# Patient Record
Sex: Male | Born: 1945 | Hispanic: No | Marital: Married | Smoking: Current every day smoker
Health system: Southern US, Community
[De-identification: ages and names within clinical notes are randomized; demographics above are authoritative.]

## PROBLEM LIST (undated history)

## (undated) DIAGNOSIS — I10 Essential (primary) hypertension: Secondary | ICD-10-CM

## (undated) HISTORY — PX: HERNIA REPAIR: SHX51

---

## 2016-02-08 ENCOUNTER — Emergency Department: Payer: Medicare Other

## 2016-02-08 ENCOUNTER — Emergency Department
Admission: EM | Admit: 2016-02-08 | Discharge: 2016-02-08 | Disposition: A | Discharge status: Home / Self Care | Payer: Medicare Other | Attending: Emergency Medicine | Admitting: Emergency Medicine

## 2016-02-08 ENCOUNTER — Encounter: Payer: Self-pay | Admitting: Emergency Medicine

## 2016-02-08 DIAGNOSIS — F17210 Nicotine dependence, cigarettes, uncomplicated: Secondary | ICD-10-CM | POA: Diagnosis not present

## 2016-02-08 DIAGNOSIS — R079 Chest pain, unspecified: Secondary | ICD-10-CM | POA: Diagnosis not present

## 2016-02-08 DIAGNOSIS — R1010 Upper abdominal pain, unspecified: Secondary | ICD-10-CM | POA: Diagnosis not present

## 2016-02-08 DIAGNOSIS — I10 Essential (primary) hypertension: Secondary | ICD-10-CM | POA: Diagnosis not present

## 2016-02-08 HISTORY — DX: Essential (primary) hypertension: I10

## 2016-02-08 LAB — URINALYSIS COMPLETE WITH MICROSCOPIC (ARMC ONLY)
BACTERIA UA: NONE SEEN
Bilirubin Urine: NEGATIVE
GLUCOSE, UA: NEGATIVE mg/dL
Ketones, ur: NEGATIVE mg/dL
Leukocytes, UA: NEGATIVE
Nitrite: NEGATIVE
Protein, ur: NEGATIVE mg/dL
SQUAMOUS EPITHELIAL / LPF: NONE SEEN
Specific Gravity, Urine: 1.006 (ref 1.005–1.030)
WBC UA: NONE SEEN WBC/hpf (ref 0–5)
pH: 5 (ref 5.0–8.0)

## 2016-02-08 LAB — LIPASE, BLOOD: Lipase: 21 U/L (ref 11–51)

## 2016-02-08 LAB — HEPATIC FUNCTION PANEL
ALT: 14 U/L — AB (ref 17–63)
AST: 21 U/L (ref 15–41)
Albumin: 3.8 g/dL (ref 3.5–5.0)
Alkaline Phosphatase: 60 U/L (ref 38–126)
BILIRUBIN DIRECT: 0.1 mg/dL (ref 0.1–0.5)
BILIRUBIN TOTAL: 0.8 mg/dL (ref 0.3–1.2)
Indirect Bilirubin: 0.7 mg/dL (ref 0.3–0.9)
Total Protein: 7.1 g/dL (ref 6.5–8.1)

## 2016-02-08 LAB — BASIC METABOLIC PANEL
ANION GAP: 9 (ref 5–15)
BUN: 11 mg/dL (ref 6–20)
CALCIUM: 9.4 mg/dL (ref 8.9–10.3)
CO2: 27 mmol/L (ref 22–32)
Chloride: 101 mmol/L (ref 101–111)
Creatinine, Ser: 0.75 mg/dL (ref 0.61–1.24)
GFR calc Af Amer: >60 mL/min (ref >60)
GFR calc non Af Amer: >60 mL/min (ref >60)
GLUCOSE: 162 mg/dL — AB (ref 65–99)
POTASSIUM: 3.7 mmol/L (ref 3.5–5.1)
Sodium: 137 mmol/L (ref 135–145)

## 2016-02-08 LAB — CBC
HEMATOCRIT: 40 % (ref 40.0–52.0)
HEMOGLOBIN: 13.4 g/dL (ref 13.0–18.0)
MCH: 30 pg (ref 26.0–34.0)
MCHC: 33.5 g/dL (ref 32.0–36.0)
MCV: 89.7 fL (ref 80.0–100.0)
Platelets: 196 10*3/uL (ref 150–440)
RBC: 4.46 MIL/uL (ref 4.40–5.90)
RDW: 14.7 % — ABNORMAL HIGH (ref 11.5–14.5)
WBC: 12.1 10*3/uL — ABNORMAL HIGH (ref 3.8–10.6)

## 2016-02-08 LAB — TROPONIN I
Troponin I: <0.03 ng/mL (ref <0.03)
Troponin I: <0.03 ng/mL (ref <0.03)

## 2016-02-08 MED ORDER — MORPHINE SULFATE (PF) 4 MG/ML IV SOLN
4.0000 mg | Freq: Once | INTRAVENOUS | Status: AC
  Administered 2016-02-08: 4 mg via INTRAVENOUS
  Filled 2016-02-08: qty 1

## 2016-02-08 MED ORDER — SODIUM CHLORIDE 0.9 % IV BOLUS (SEPSIS)
1000.0000 mL | Freq: Once | INTRAVENOUS | Status: AC
  Administered 2016-02-08: 1000 mL via INTRAVENOUS

## 2016-02-08 MED ORDER — ONDANSETRON HCL 4 MG/2ML IJ SOLN
4.0000 mg | Freq: Once | INTRAMUSCULAR | Status: AC
  Administered 2016-02-08: 4 mg via INTRAVENOUS
  Filled 2016-02-08: qty 2

## 2016-02-08 MED ORDER — DIATRIZOATE MEGLUMINE & SODIUM 66-10 % PO SOLN
15.0000 mL | Freq: Once | ORAL | Status: AC
  Administered 2016-02-08: 15 mL via ORAL

## 2016-02-08 MED ORDER — PANTOPRAZOLE SODIUM 40 MG PO TBEC: 40.0000 mg | DELAYED_RELEASE_TABLET | Freq: Every day | ORAL | 1 refills | Status: AC

## 2016-02-08 MED ORDER — IOPAMIDOL (ISOVUE-370) INJECTION 76%
100.0000 mL | Freq: Once | INTRAVENOUS | Status: AC | PRN
  Administered 2016-02-08: 100 mL via INTRAVENOUS

## 2016-02-08 NOTE — Discharge Instructions (Signed)
As we have discussed your workup today shows normal labs. Your CT scans show a 4 mm left lung nodule which needs a repeat CT scan in 6-12 months to ensure that it is not enlarging. If CT scan also shows esophageal thickening which could be due to reflux, or could possibly be cancerous. As we discussed please take your Protonix every day as prescribed. Please use over-the-counter Maalox for any discomfort. Please follow-up with GI medicine once you return to the [place] to arrange an endoscopy as soon as possible. Your CT scans have been printed and provided to you to bring to your doctor in GI medicine doctor. Please return to the emergency department for any return of your chest pain, or any other symptom personally concerning to your self.

## 2016-02-08 NOTE — ED Triage Notes (Signed)
Pt arrived via EMS from home. EMS reports NSR, 98% RA, lungs clear to auscultation, VSS. Pt speaks Arabic. Video intrepreter used during triage. Pt reports chest pain that began this morning. Pt reports SOB and dizziness and headache. Pt reports pain radiates to right arm. Pt states he fell in [place] over a year ago and has chronic back pain as a result.

## 2016-02-08 NOTE — ED Provider Notes (Addendum)
[facility] Emergency Department Provider Note   ____________________________________________   First MD Initiated Contact with Patient 02/08/16 1331     (approximate)  I have reviewed the triage vital signs and the nursing notes. Arabic speaking. Spoke with the patient via the interpreter iPad.  HISTORY  Chief Complaint Chest Pain   HPI Terry Petty is a 70 y.o. male with a history of hypertension was presenting to the emergency department today for upper abdominal pain. He says that the pain is a 7-8 out of 10 and started about 4 AM this morning. He says that woke him up from sleep. He denies any nausea or vomiting and says that he has also had his last bowel movement earlier today. He has a history of an abdominal hernia surgery remotely. Says that he has had urinary frequency but suspects this may be from his medications. He takes both Lasix and HCTZ. Denies any chest pain and denies any increase in pain with deep inspiration.   Past Medical History:  Diagnosis Date  . Hypertension     There are no active problems to display for this patient.   Past Surgical History:  Procedure Laterality Date  . HERNIA REPAIR      Prior to Admission medications   Not on File    Allergies Review of patient's allergies indicates no known allergies.  No family history on file.  Social History Social History  Substance Use Topics  . Smoking status: Current Every Day Smoker    Packs/day: 0.50    Types: Cigarettes  . Smokeless tobacco: Not on file  . Alcohol use No    Review of Systems Constitutional: No fever/chills Eyes: No visual changes. ENT: No sore throat. Cardiovascular: Denies chest pain. Respiratory: Denies shortness of breath. Gastrointestinal: no nausea, no vomiting.  No diarrhea.  No constipation. Genitourinary: Negative for dysuria. Musculoskeletal: Chronic left-sided back pain that is unchanged. Skin: Negative for  rash. Neurological: Negative for headaches, focal weakness or numbness.  10-point ROS otherwise negative.  ____________________________________________   PHYSICAL EXAM:  VITAL SIGNS: ED Triage Vitals  Enc Vitals Group     BP 02/08/16 1201 135/78     Pulse Rate 02/08/16 1201 89     Resp 02/08/16 1201 18     Temp 02/08/16 1201 99.8 F (37.7 C)     Temp Source 02/08/16 1201 Oral     SpO2 02/08/16 1201 97 %     Weight 02/08/16 1201 185 lb (83.9 kg)     Height 02/08/16 1201  (1.803 m)     Head Circumference --      Peak Flow --      Pain Score 02/08/16 1157 7     Pain Loc --      Pain Edu? --      Excl. in GC? --     Constitutional: Alert and oriented. Well appearing and in no acute distress. Eyes: Conjunctivae are normal. PERRL. EOMI. Head: Atraumatic. Nose: No congestion/rhinnorhea. Mouth/Throat: Mucous membranes are moist.   Neck: No stridor.   Cardiovascular: Normal rate, regular rhythm. Grossly normal heart sounds.   Respiratory: Normal respiratory effort.  No retractions. Lungs CTAB. Gastrointestinal: Soft With tenderness across the upper abdomen which is moderate. There is a negative Murphy sign. Mild distention. No CVA tenderness. Musculoskeletal: No lower extremity tenderness nor edema.  No joint effusions. Neurologic:  Normal speech and language. No gross focal neurologic deficits are appreciated. Skin:  Skin is warm, dry and  intact. No rash noted. Psychiatric: Mood and affect are normal. Speech and behavior are normal.  ____________________________________________   LABS (all labs ordered are listed, but only abnormal results are displayed)  Labs Reviewed  BASIC METABOLIC PANEL - Abnormal; Notable for the following:       Result Value   Glucose, Bld 162 (*)    All other components within normal limits  CBC - Abnormal; Notable for the following:    WBC 12.1 (*)    RDW 14.7 (*)    All other components within normal limits  TROPONIN I  LIPASE, BLOOD   HEPATIC FUNCTION PANEL  TROPONIN I   ____________________________________________  EKG  ED ECG REPORT I, Arelia Longest, the attending physician, personally viewed and interpreted this ECG.   Date: 02/08/2016  EKG Time: 12  Rate: 88  Rhythm: normal sinus rhythm  Axis: Normal axis  Intervals:none  ST&T Change: No ST segment elevation or depression. There are T-wave inversions in V4 through V6. There is no previous EKG on the record for comparison.  ____________________________________________  RADIOLOGY  CLINICAL DATA:  Chest pain.  Hypertension EXAM: CHEST  2 VIEW COMPARISON:  None. FINDINGS: There is scarring in the lung bases. There is no edema or consolidation. On the lateral view, there is a nodular opacity overlying the posterior heart measuring 1.1 x 1.0 cm. Heart is upper normal in size. Aorta is tortuous with atherosclerotic calcification. There is evidence of a hiatal hernia. There is anterior wedging of the L1 vertebral body. IMPRESSION: 1.1 x 1.0 cm nodular opacity overlying the posterior heart on the lateral view. This finding is not confirmed on the frontal view. Although structure may represent a prominent vessel, the concern for potential nodular lesion warrants noncontrast enhanced chest CT to further assess. Hiatal hernia. No edema or consolidation. Heart upper normal in size. Bibasilar lung scarring. Electronically Signed   By: Bretta Bang III M.D.   On: 02/08/2016 12:51 ____________________________________________   PROCEDURES   Procedures  ____________________________________________   INITIAL IMPRESSION / ASSESSMENT AND PLAN / ED COURSE  Pertinent labs & imaging results that were available during my care of the patient were reviewed by me and considered in my medical decision making (see chart for details).  ----------------------------------------- 3:15 PM on  02/08/2016 -----------------------------------------  Patient found to have a 1 cm lesion retrocardiac. Unclear if patient has follow-up, even in [place] where he lives. The patient is here with his son visiting to [place]. The patient's son says that he is establishing care with Medicare at this time. Because I am much of the patient will be able to follow-up OB ordering a CAT scan of the chest in addition to a CAT scan of the abdomen for both the acute issue as well as the retrocardiac mass. Signed out to Dr. Lenard Lance.  Clinical Course     ____________________________________________   FINAL CLINICAL IMPRESSION(S) / ED DIAGNOSES  Upper abdominal pain.    NEW MEDICATIONS STARTED DURING THIS VISIT:  New Prescriptions   No medications on file     Note:  This document was prepared using Dragon voice recognition software and may include unintentional dictation errors.    Myrna Blazer, MD 02/08/16 1517  Patient denied any chest pain to me although in the triage note he describes chest pain radiating into the right arm. He does have EKG changes. We will be repeating his troponin.   Myrna Blazer, MD 02/08/16 [phone]

## 2016-02-08 NOTE — ED Provider Notes (Signed)
-----------------------------------------   5:55 PM on 02/08/2016 -----------------------------------------  Patient care is him from Dr. Langston Masker. Patient's second troponin and LFTs are normal. Patient CT scan does show a pulmonary nodule which needs 12 month CT follow-up. Esophageal thickening which needs endoscopy follow-up. I discussed this with the patient with the interpreter. He is agreeable to GI medicine follow-up and will follow-up with his doctors well when he returns to [place] in one week. We will place the patient on Protonix, over-the-counter Maalox for discomfort. Patient will call his primary care doctor for a follow-up appointment as well as a GI medicine doctor in [place].   Minna Antis, MD 02/08/16 [phone]

## 2016-02-08 NOTE — ED Notes (Signed)
Pt back to room from CT

## 2017-07-12 IMAGING — CT CT ANGIO CHEST
3 of 11 series · 17 of 46 positions shown · IV contrast (APPLIED)
Comparison: None.

CLINICAL DATA: Chest pain began this morning with shortness of
breath dizziness and headache. Abdominal distention with upper
abdominal pain.



[Series 5: pe thins 1.5 · axial · 0.68mm/px · z∈[-281,-15]mm · 13 of 260 slices shown]
[im 19/260  lung]
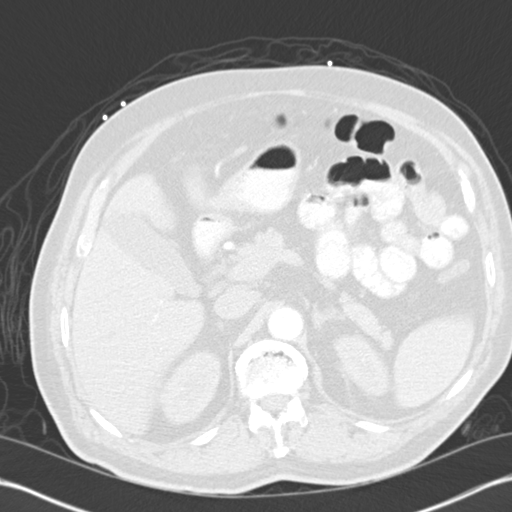
[im 38/260  soft-tissue]
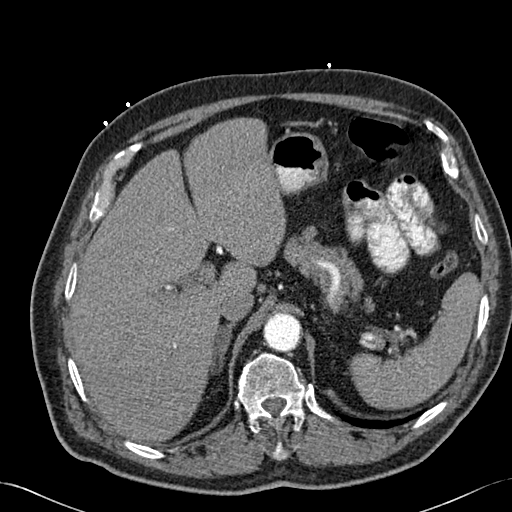
[im 56/260  lung]
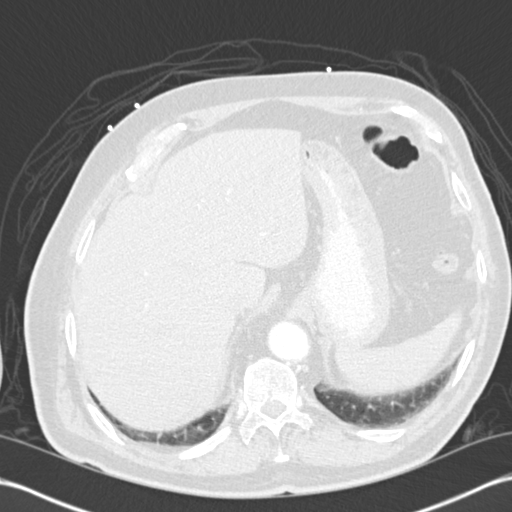
[im 75/260  soft-tissue]
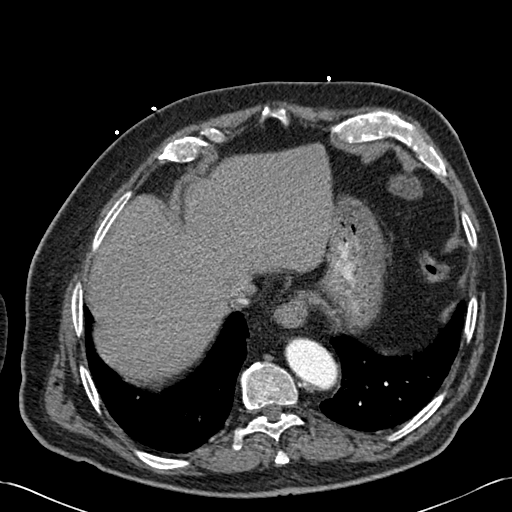
[im 93/260  lung]
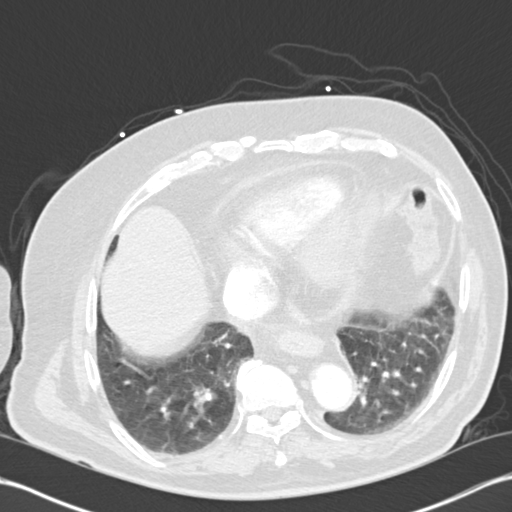
[im 112/260  soft-tissue]
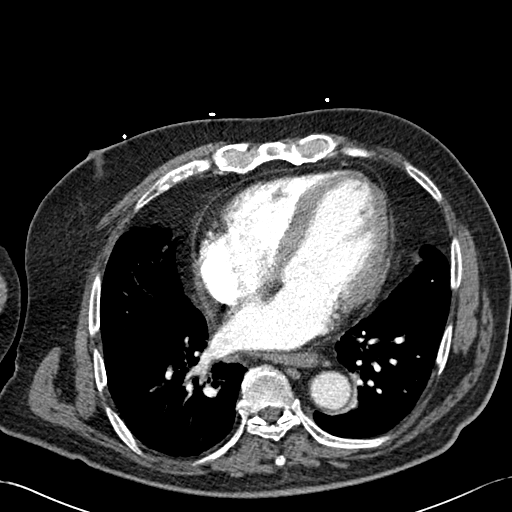
[im 130/260  lung]
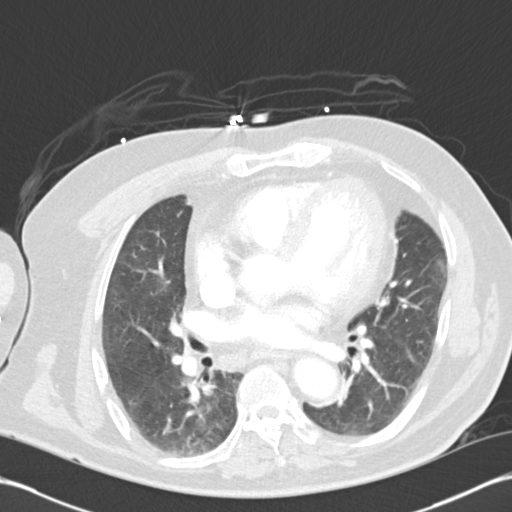
[im 149/260  soft-tissue]
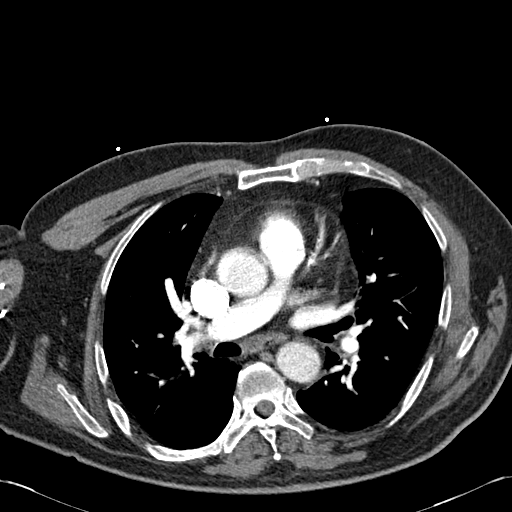
[im 167/260  lung]
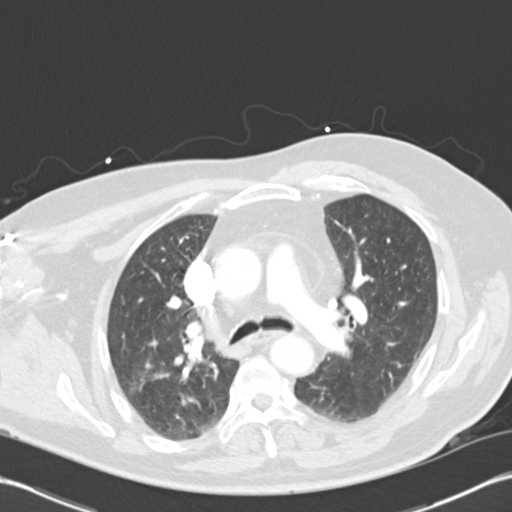
[im 186/260  soft-tissue]
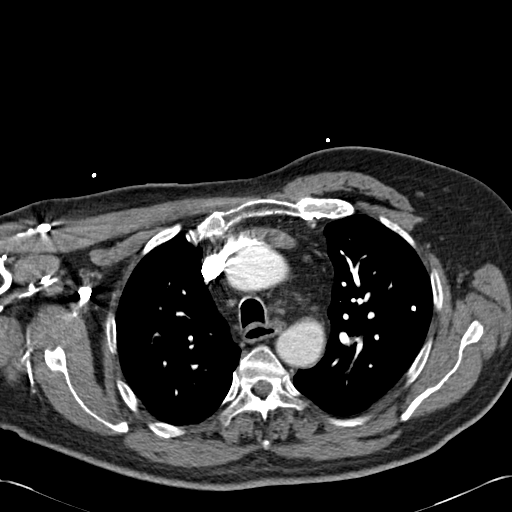
[im 204/260  lung]
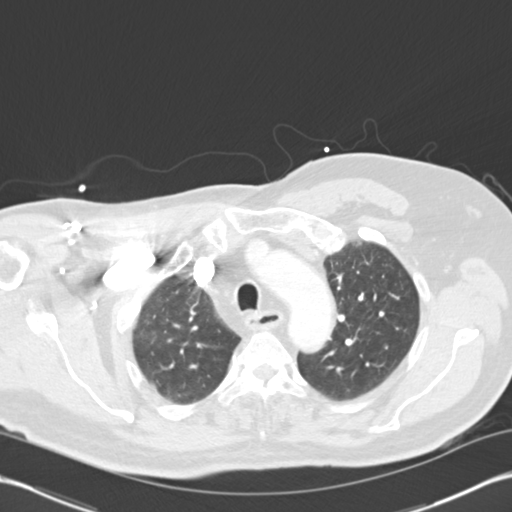
[im 223/260  soft-tissue]
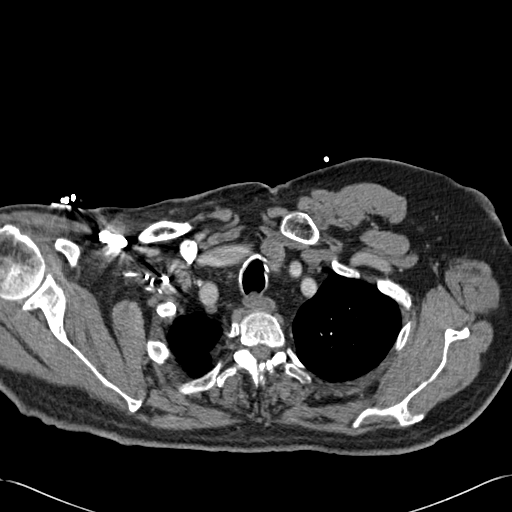
[im 241/260  lung]
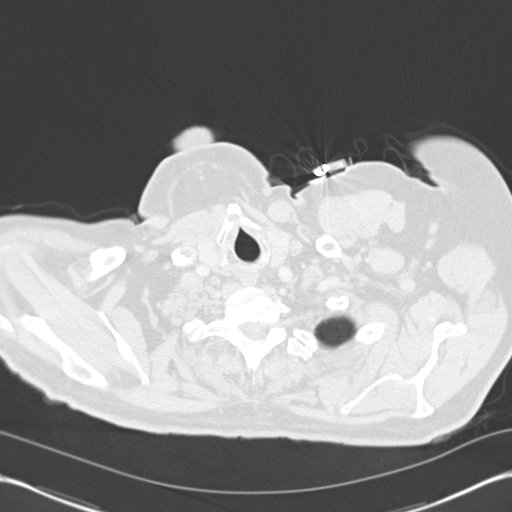

[Series 7: routine abd pel with · axial · 0.71mm/px · z∈[-502,-388]mm · 2 of 94 slices shown]
[im 24/94  lung]
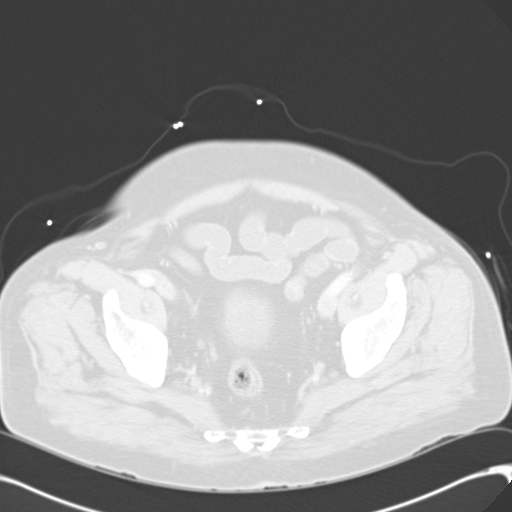
[im 47/94  lung]
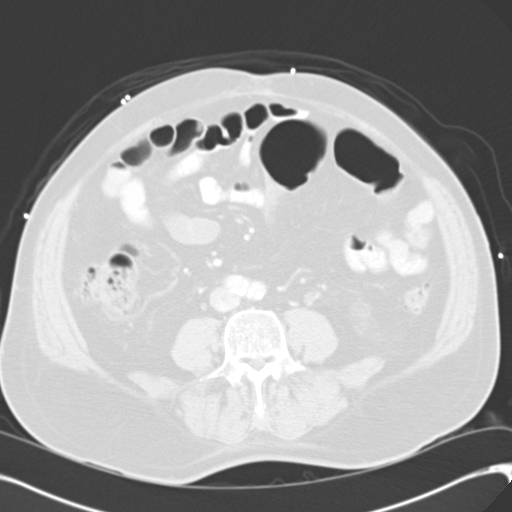

[Series 9: cor mpr 2.0 · coronal · 0.64mm/px · 2 of 132 slices shown]
[im 44/132  soft-tissue]
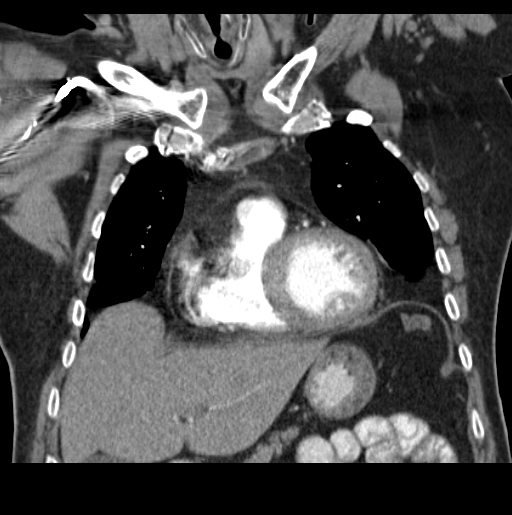
[im 88/132  soft-tissue]
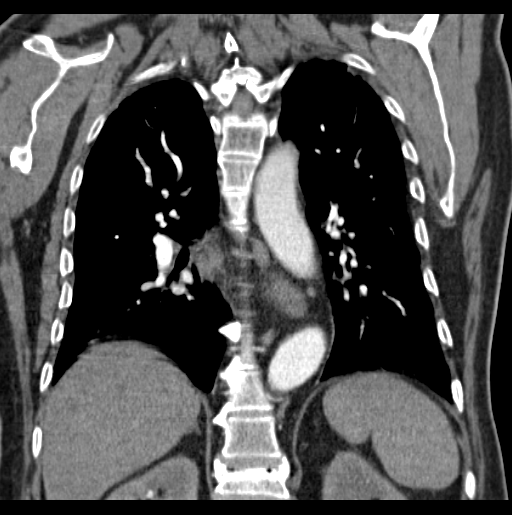

[17 of 46 positions shown; findings below may reference images not displayed]

FINDINGS: CTA CHEST FINDINGS

Mediastinum/Lymph nodes: The heart is enlarged. No pericardial
effusion. No thoracic aortic aneurysm. 2 cm exophytic nodule noted
inferior left thyroid lobe.

No dissection of the thoracic aorta. No filling defect within the
opacified pulmonary arteries to suggest the presence of an acute
pulmonary embolus.

Focal 15 mm nodular soft tissue lesion identified in the proximal
esophagus, just cranial to the carina (see image 41 series 4 and
image 78 series 10).

No mediastinal lymphadenopathy. There is no axillary
lymphadenopathy. There is no hilar lymphadenopathy.

Lungs/Pleura: 4 mm left upper lobe pulmonary nodule visualized image
63 series 6. No focal airspace consolidation. No pulmonary edema or
pleural effusion.

Musculoskeletal: Bone windows reveal no worrisome lytic or sclerotic
osseous lesions.

CT ABDOMEN and PELVIS FINDINGS

Hepatobiliary: No focal abnormality within the liver parenchyma.
There is no evidence for gallstones, gallbladder wall thickening, or
pericholecystic fluid. No intrahepatic or extrahepatic biliary
dilation.

Pancreas: No focal mass lesion. No dilatation of the main duct. No
intraparenchymal cyst. No peripancreatic edema.

Spleen: No splenomegaly. No focal mass lesion.

Adrenals/Urinary tract: 1.6 cm right adrenal nodule cannot be
definitively characterize. Patient also has a 11 mm nodule in the
left adrenal gland which cannot be definitively characterize. 10 mm
cortical hypodensity upper pole left kidney likely a cyst.

Central sinus cyst noted in the right kidney with 10 mm
nonobstructing right lower pole renal stone. No evidence for
hydroureter. The urinary bladder appears normal for the degree of
distention.

Stomach/Bowel: Stomach is nondistended. No gastric wall thickening.
No evidence of outlet obstruction. Duodenum is normally positioned
as is the ligament of Treitz. No small bowel wall thickening. No
small bowel dilatation. The terminal ileum is normal. The appendix
is normal. Diverticular changes are noted in the left colon without
evidence of diverticulitis.

Vascular/Lymphatic: There is abdominal aortic atherosclerosis
without aneurysm. There is no gastrohepatic or hepatoduodenal
ligament lymphadenopathy. No intraperitoneal or retroperitoneal
lymphadenopathy. No pelvic sidewall lymphadenopathy.

Reproductive: The prostate gland and seminal vesicles have normal
imaging features.

Other: No intraperitoneal free fluid.

Musculoskeletal: Patient is status post age indeterminate L1
compression fracture.

Review of the MIP images confirms the above findings.
IMPRESSION: 1. No CT evidence for acute pulmonary embolus.
2. 15 mm focal area of soft tissue thickening in the wall of the
proximal esophagus. Esophageal neoplasm not excluded. Consider upper
endoscopy to further evaluate.
3. 4 mm left upper lobe pulmonary nodule. No follow-up needed if
patient is low-risk. Non-contrast chest CT can be considered in 12
months if patient is high-risk. This recommendation follows the
consensus statement: Guidelines for Management of Incidental
Pulmonary Nodules Detected on CT Images:From the [HOSPITAL]
0815; published online before print (10.1148/radiol.[id number]).
4. Bilateral small adrenal nodules with washout characteristics are
insufficient to allow definitive characterization. MRI with in and
out of phase imaging may be able to more characterize, as clinically
warranted. Definitively spectral sinus cyst right kidney with
cortical cyst left kidney. There is a nonobstructing 10 mm stone in
the lower pole right kidney.
5. L1 compression fracture.

## 2021-11-12 DEATH — deceased
